# Patient Record
Sex: Female | Born: 1965 | Hispanic: No | Smoking: Current every day smoker
Health system: Southern US, Community
[De-identification: ages and names within clinical notes are randomized; demographics above are authoritative.]

## PROBLEM LIST (undated history)

## (undated) DIAGNOSIS — F319 Bipolar disorder, unspecified: Secondary | ICD-10-CM

## (undated) HISTORY — PX: FOOT SURGERY: SHX648

## (undated) HISTORY — PX: CLAVICLE SURGERY: SHX598

## (undated) HISTORY — PX: CERVICAL SPINE SURGERY: SHX589

---

## 1998-05-20 ENCOUNTER — Other Ambulatory Visit: Admission: RE | Admit: 1998-05-20 | Discharge: 1998-05-20 | Payer: Self-pay | Admitting: Obstetrics and Gynecology

## 1999-05-08 ENCOUNTER — Other Ambulatory Visit: Admission: RE | Admit: 1999-05-08 | Discharge: 1999-05-08 | Payer: Self-pay | Admitting: Obstetrics and Gynecology

## 2000-05-20 ENCOUNTER — Other Ambulatory Visit: Admission: RE | Admit: 2000-05-20 | Discharge: 2000-05-20 | Payer: Self-pay | Admitting: Obstetrics and Gynecology

## 2010-12-03 ENCOUNTER — Encounter: Payer: Self-pay | Admitting: Gynecology

## 2017-12-27 ENCOUNTER — Emergency Department (HOSPITAL_BASED_OUTPATIENT_CLINIC_OR_DEPARTMENT_OTHER)
Admission: EM | Admit: 2017-12-27 | Discharge: 2017-12-27 | Disposition: A | Discharge status: Home / Self Care | Payer: PRIVATE HEALTH INSURANCE | Attending: Emergency Medicine | Admitting: Emergency Medicine

## 2017-12-27 ENCOUNTER — Encounter (HOSPITAL_BASED_OUTPATIENT_CLINIC_OR_DEPARTMENT_OTHER): Payer: Self-pay

## 2017-12-27 ENCOUNTER — Other Ambulatory Visit: Payer: Self-pay

## 2017-12-27 ENCOUNTER — Emergency Department (HOSPITAL_BASED_OUTPATIENT_CLINIC_OR_DEPARTMENT_OTHER): Payer: PRIVATE HEALTH INSURANCE

## 2017-12-27 DIAGNOSIS — M5412 Radiculopathy, cervical region: Secondary | ICD-10-CM | POA: Diagnosis not present

## 2017-12-27 DIAGNOSIS — M542 Cervicalgia: Secondary | ICD-10-CM | POA: Diagnosis present

## 2017-12-27 DIAGNOSIS — F17200 Nicotine dependence, unspecified, uncomplicated: Secondary | ICD-10-CM | POA: Insufficient documentation

## 2017-12-27 DIAGNOSIS — F1210 Cannabis abuse, uncomplicated: Secondary | ICD-10-CM | POA: Insufficient documentation

## 2017-12-27 HISTORY — DX: Bipolar disorder, unspecified: F31.9

## 2017-12-27 MED ORDER — TRAMADOL HCL 50 MG PO TABS: 50.0000 mg | ORAL_TABLET | Freq: Four times a day (QID) | ORAL | 0 refills | Status: AC | PRN

## 2017-12-27 MED ORDER — IBUPROFEN 800 MG PO TABS: 800.0000 mg | ORAL_TABLET | Freq: Three times a day (TID) | ORAL | 0 refills | Status: AC | PRN

## 2017-12-27 MED ORDER — PREDNISONE 20 MG PO TABS: 40.0000 mg | ORAL_TABLET | Freq: Every day | ORAL | 0 refills | Status: AC

## 2017-12-27 NOTE — ED Provider Notes (Signed)
Emergency Department Provider Note   I have reviewed the triage vital signs and the nursing notes.   HISTORY  Chief Complaint Neck Pain   HPI Johnay Mano is a 52 y.o. female with PMH of Bipolar disorder and prior C-spine fixation in 2006 presents to the ED with posterior neck pain rating down the left arm.  She denies numbness to me or weakness.  She states she has a pins-and-needles sensation in her left hand and pain throughout the entire arm.  She states by turning her neck in certain positions the pain feels slightly better.  She denies any weak grip strength or dropping objects unintentionally.  No leg or face symptoms.  She states that she is seen her primary care physician and orthopedic surgeon who operated on her right clavicle but this issue has come up newly in the last week. Denies any vision changes.   Past Medical History:  Diagnosis Date  . Bipolar disorder (HCC)     There are no active problems to display for this patient.   Past Surgical History:  Procedure Laterality Date  . CERVICAL SPINE SURGERY    . CLAVICLE SURGERY    . FOOT SURGERY        Allergies Patient has no known allergies.  No family history on file.  Social History Social History   Tobacco Use  . Smoking status: Current Every Day Smoker  . Smokeless tobacco: Never Used  Substance Use Topics  . Alcohol use: Not Currently    Frequency: Never  . Drug use: Yes    Types: Marijuana    Review of Systems  Constitutional: No fever/chills Eyes: No visual changes. ENT: No sore throat. Cardiovascular: Denies chest pain. Respiratory: Denies shortness of breath. Gastrointestinal: No abdominal pain.  No nausea, no vomiting.  No diarrhea.  No constipation. Genitourinary: Negative for dysuria. Musculoskeletal: Chronic lower back pain. Positive left arm pain and neck pain.  Skin: Negative for rash. Neurological: Negative for headaches, focal weakness or numbness. Tingling in the  left hand intermittently.   10-point ROS otherwise negative.  ____________________________________________   PHYSICAL EXAM:  VITAL SIGNS: ED Triage Vitals  Enc Vitals Group     BP 12/27/17 1611 116/63     Pulse Rate 12/27/17 1611 76     Resp 12/27/17 1611 18     Temp 12/27/17 1611 98.3 F (36.8 C)     Temp Source 12/27/17 1611 Oral     SpO2 12/27/17 1611 100 %     Weight 12/27/17 1611 132 lb (59.9 kg)     Height 12/27/17 1611 5\' 1"  (1.549 m)     Pain Score 12/27/17 1608 10   Constitutional: Alert and oriented. Well appearing and in no acute distress. Eyes: Conjunctivae are normal.  Head: Atraumatic. Nose: No congestion/rhinnorhea. Mouth/Throat: Mucous membranes are moist.  Oropharynx non-erythematous. Neck: No stridor. No cervical spine tenderness to palpation. Some mild left paracervical tenderness and pain to palpation over the left trapezius.  Cardiovascular: Normal rate, regular rhythm. Good peripheral circulation. Grossly normal heart sounds.   Respiratory: Normal respiratory effort.  No retractions. Lungs CTAB. Gastrointestinal: Soft and nontender. No distention.  Musculoskeletal: No lower extremity tenderness nor edema. No gross deformities of extremities. Neurologic:  Normal speech and language. No gross focal neurologic deficits are appreciated. Normal strength and sensation in the LUE and RUE. Normal grip strength. No pronator drift.  Skin:  Skin is warm, dry and intact. No rash noted.  ____________________________________________  RADIOLOGY  Dg  Cervical Spine Complete  Result Date: 12/27/2017 CLINICAL DATA:  Tingling and numbness for 1 week of the left shoulder, no injury EXAM: CERVICAL SPINE - COMPLETE 4+ VIEW COMPARISON:  Cervical spine films of 04/11/2015 FINDINGS: Anterior fusion plate from [id number] is unchanged in position. Interbody fusion plugs at C4-5 and C5-6 remain. There is anterior osteophyte formation at C3-4 which appears stable. There is degenerative  disc disease at C6-7 which may have progressed somewhat in the interval. No prevertebral soft tissue swelling is seen. There is some foraminal narrowing bilaterally at C4-5, C5-6, and C6-7 levels. The odontoid process is intact. The lung apices are clear. IMPRESSION: 1. Stable anterior fusion from C4-C6 with straightened alignment. 2. Degenerative disc disease at C6-7. 3. Foraminal narrowing bilaterally at C4-5, C5-6, and C6-7 levels. Electronically Signed   By: Dwyane DeePaul  Barry M.D.   On: 12/27/2017 17:11    ____________________________________________   PROCEDURES  Procedure(s) performed:   Procedures  None ____________________________________________   INITIAL IMPRESSION / ASSESSMENT AND PLAN / ED COURSE  Pertinent labs & imaging results that were available during my care of the patient were reviewed by me and considered in my medical decision making (see chart for details).  Patient presents to the emergency department for evaluation of posterior neck pain rating down the left arm with pins-and-needles sensation in the hand.  She has normal strength and sensation on my exam.  Symptoms seem radicular in nature.  Plan for plain films of the cervical spine and advised she reevaluate with her PCP and possibly neurosurgeon with worsening symptoms.  No indication for emergency MRI at this time.  No findings on exam to suggest a central process.   05:30 PM She is plain film of the neck reviewed.  She has chronic changes and foraminal narrowing.  No fractures or lesions.  Provided contact information for local spine specialist for follow-up but encouraged conservative management and PCP follow-up prior to this.  Discussed return precautions in detail.  We will try steroid and short course of pain medication prioritizing NSAIDs over tramadol is only to be used for breakthrough pain.   At this time, I do not feel there is any life-threatening condition present. I have reviewed and discussed all  results (EKG, imaging, lab, urine as appropriate), exam findings with patient. I have reviewed nursing notes and appropriate previous records.  I feel the patient is safe to be discharged home without further emergent workup. Discussed usual and customary return precautions. Patient and family (if present) verbalize understanding and are comfortable with this plan.  Patient will follow-up with their primary care provider. If they do not have a primary care provider, information for follow-up has been provided to them. All questions have been answered.  ____________________________________________  FINAL CLINICAL IMPRESSION(S) / ED DIAGNOSES  Final diagnoses:  Neck pain  Cervical radiculopathy    NEW OUTPATIENT MEDICATIONS STARTED DURING THIS VISIT:  New Prescriptions   IBUPROFEN (ADVIL,MOTRIN) 800 MG TABLET    Take 1 tablet (800 mg total) by mouth every 8 (eight) hours as needed.   PREDNISONE (DELTASONE) 20 MG TABLET    Take 2 tablets (40 mg total) by mouth daily for 5 days.   TRAMADOL (ULTRAM) 50 MG TABLET    Take 1 tablet (50 mg total) by mouth every 6 (six) hours as needed.    Note:  This document was prepared using Dragon voice recognition software and may include unintentional dictation errors.  Alona BeneJoshua Long, MD Emergency Medicine    Long, Ivin BootyJoshua  G, MD 12/27/17 1731

## 2017-12-27 NOTE — Discharge Instructions (Signed)

## 2017-12-27 NOTE — ED Triage Notes (Signed)
Pt c/o posterior neck pain with numb/tingling to left arm x 1 week-denies injury-states she had neck surgery 2006-[facility]-steady gait

## 2019-03-10 IMAGING — DX DG CERVICAL SPINE COMPLETE 4+V
6 series · 6 of 6 positions shown · non-contrast
Comparison: Cervical spine films of 04/11/2015

CLINICAL DATA: Tingling and numbness for 1 week of the left
shoulder, no injury

EXAM:
CERVICAL SPINE - COMPLETE 4+ VIEW

[c-spine lat]
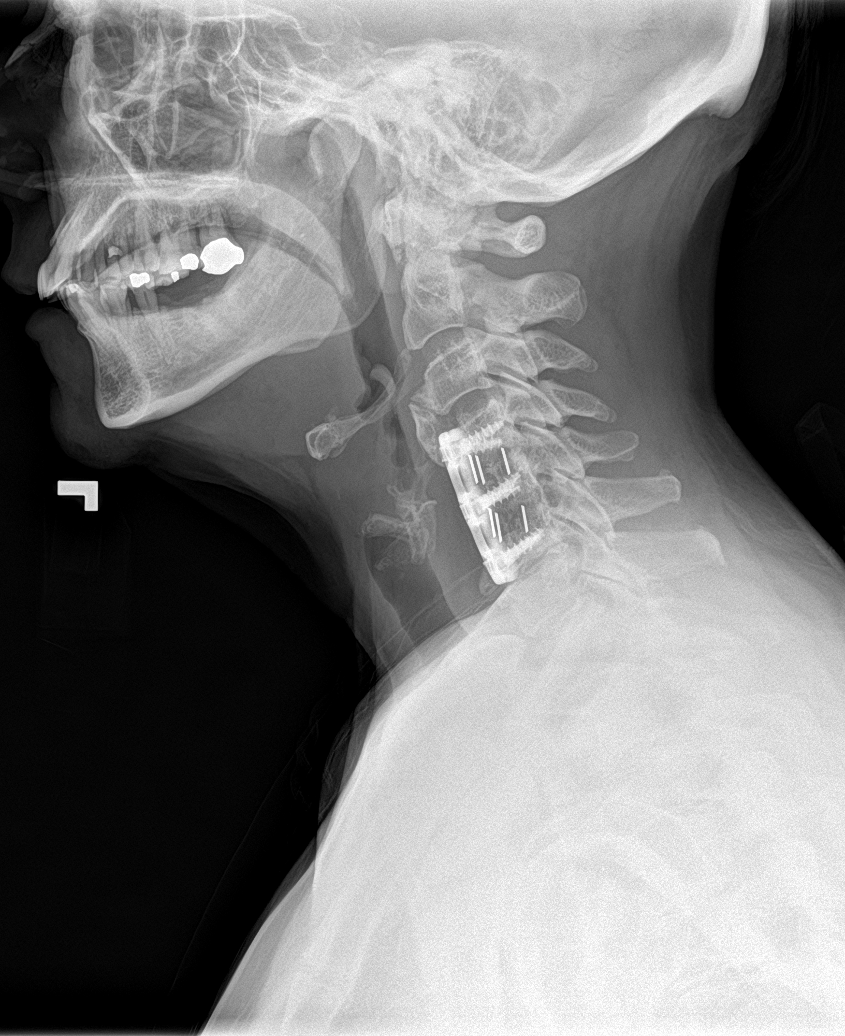

[c-spine obl (1 of 2)]
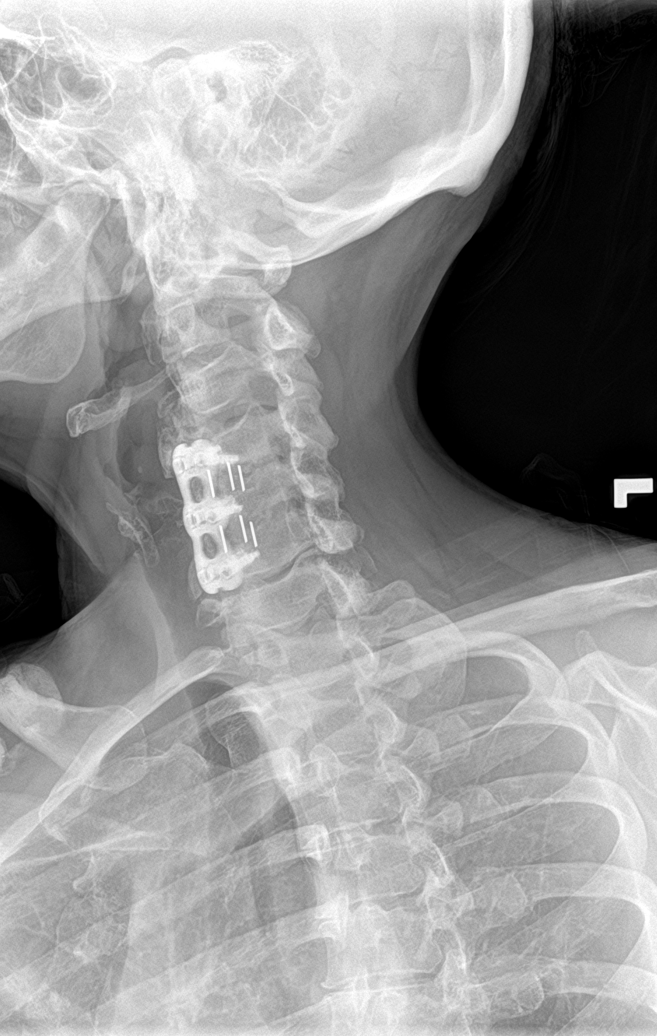

[c-spine obl (2 of 2)]
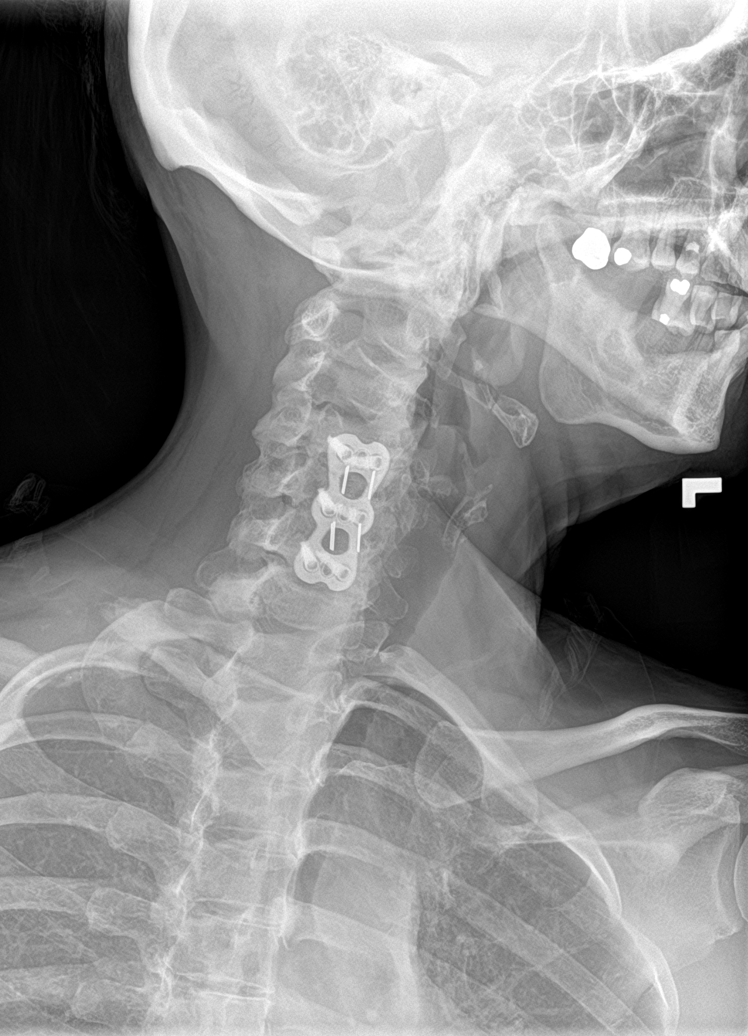

[c-spine ap]
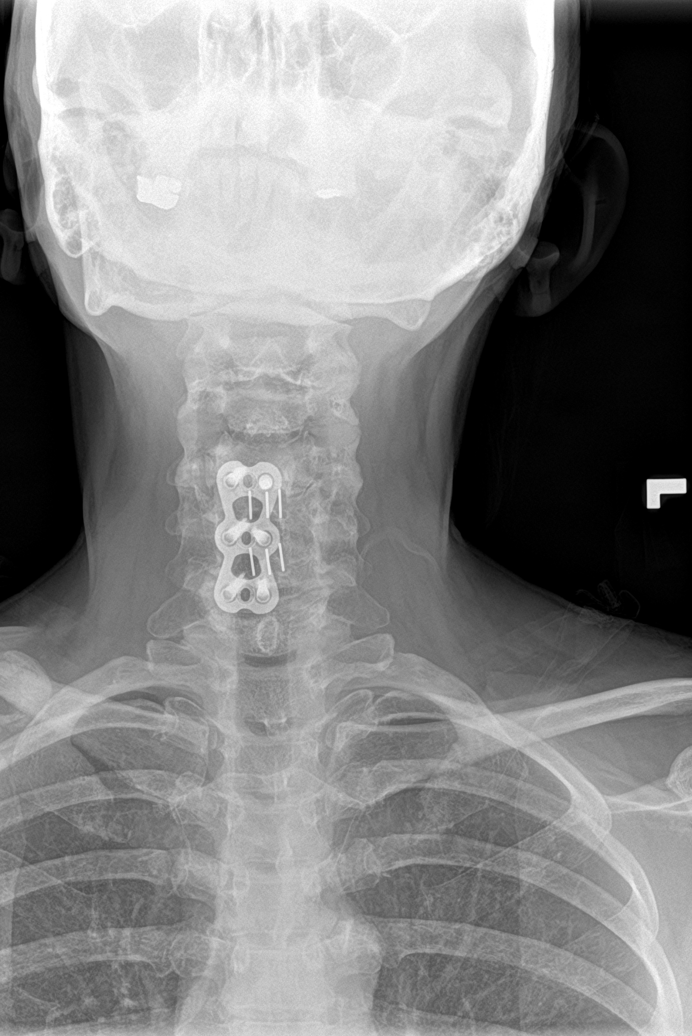

[c-spine open mouth]
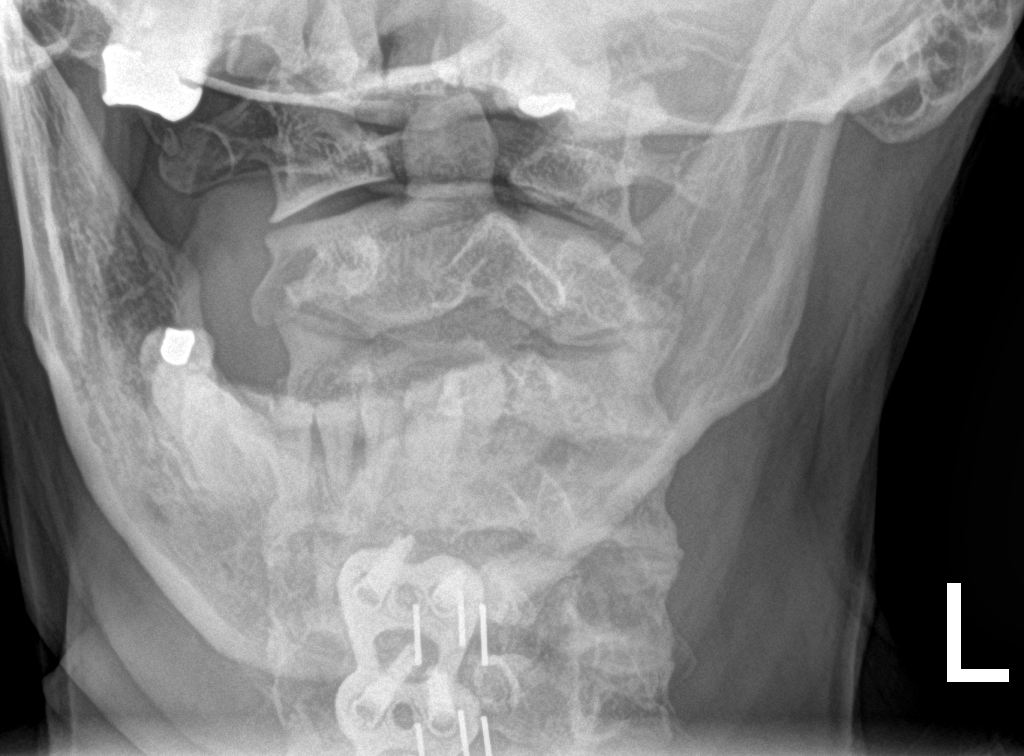

[c-spine swimmers]
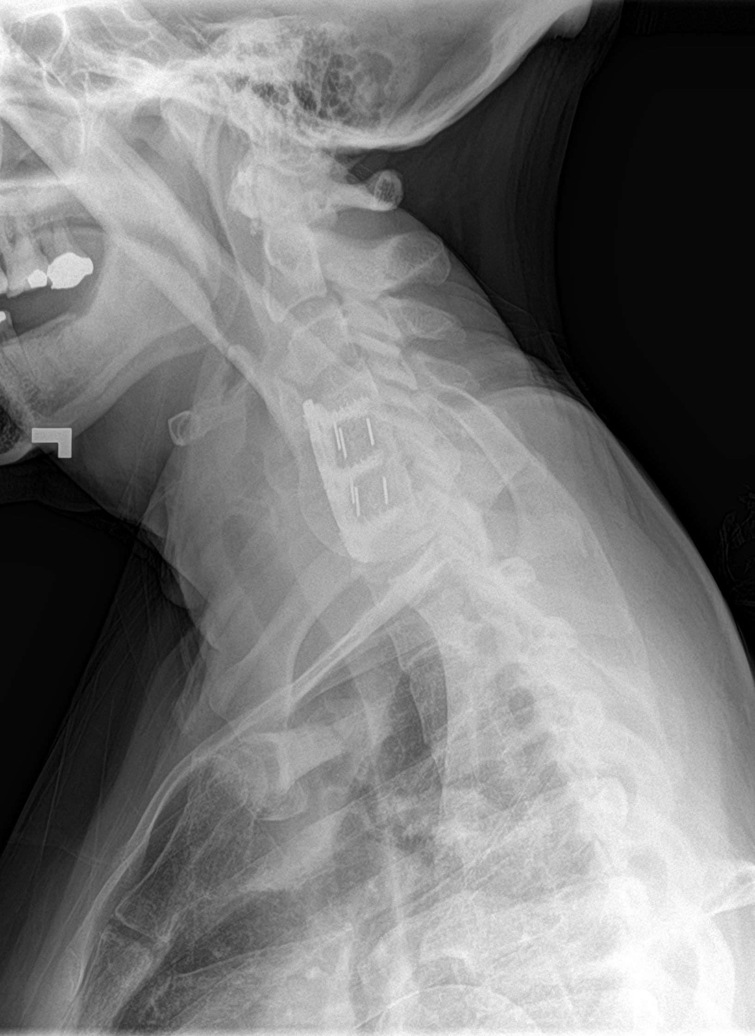

[6 of 6 positions shown; findings below may reference images not displayed]

FINDINGS: Anterior fusion plate from C4-C6 is unchanged in position. Interbody
fusion plugs at C4-5 and C5-6 remain. There is anterior osteophyte
formation at C3-4 which appears stable. There is degenerative disc
disease at C6-7 which may have progressed somewhat in the interval.
No prevertebral soft tissue swelling is seen. There is some
foraminal narrowing bilaterally at C4-5, C5-6, and C6-7 levels. The
odontoid process is intact. The lung apices are clear.
IMPRESSION: 1. Stable anterior fusion from C4-C6 with straightened alignment.
2. Degenerative disc disease at C6-7.
3. Foraminal narrowing bilaterally at C4-5, C5-6, and C6-7 levels.
